# Patient Record
Sex: Male | Born: 2000 | Race: White | Hispanic: No | Marital: Single | Smoking: Current every day smoker
Health system: Southern US, Community
[De-identification: ages and names within clinical notes are randomized; demographics above are authoritative.]

## PROBLEM LIST (undated history)

## (undated) DIAGNOSIS — Z8489 Family history of other specified conditions: Secondary | ICD-10-CM

## (undated) DIAGNOSIS — F4310 Post-traumatic stress disorder, unspecified: Secondary | ICD-10-CM

## (undated) DIAGNOSIS — J45909 Unspecified asthma, uncomplicated: Secondary | ICD-10-CM

## (undated) HISTORY — DX: Post-traumatic stress disorder, unspecified: F43.10

## (undated) HISTORY — PX: ADENOIDECTOMY: SUR15

## (undated) HISTORY — PX: TYMPANOSTOMY TUBE PLACEMENT: SHX32

---
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
## 2019-02-24 ENCOUNTER — Other Ambulatory Visit: Payer: Self-pay

## 2019-02-24 ENCOUNTER — Ambulatory Visit (HOSPITAL_COMMUNITY)
Admission: EM | Admit: 2019-02-24 | Discharge: 2019-02-24 | Disposition: A | Discharge status: Home / Self Care | Payer: Medicaid Other | Attending: Internal Medicine | Admitting: Internal Medicine

## 2019-02-24 ENCOUNTER — Encounter (HOSPITAL_COMMUNITY): Payer: Self-pay

## 2019-02-24 DIAGNOSIS — J209 Acute bronchitis, unspecified: Secondary | ICD-10-CM | POA: Diagnosis not present

## 2019-02-24 DIAGNOSIS — R059 Cough, unspecified: Secondary | ICD-10-CM

## 2019-02-24 DIAGNOSIS — R05 Cough: Secondary | ICD-10-CM

## 2019-02-24 HISTORY — DX: Unspecified asthma, uncomplicated: J45.909

## 2019-02-24 MED ORDER — AZITHROMYCIN 250 MG PO TABS: 250.0000 mg | ORAL_TABLET | Freq: Every day | ORAL | 0 refills | Status: AC

## 2019-02-24 MED ORDER — DM-GUAIFENESIN ER 30-600 MG PO TB12: 1.0000 | ORAL_TABLET | Freq: Two times a day (BID) | ORAL | 0 refills | Status: AC

## 2019-02-24 MED ORDER — BENZONATATE 100 MG PO CAPS: 100.0000 mg | ORAL_CAPSULE | Freq: Three times a day (TID) | ORAL | 0 refills | Status: AC

## 2019-02-24 NOTE — ED Provider Notes (Signed)
[place]    CSN: [id number] Arrival date & time: 02/24/19  1340      History   Chief Complaint Chief Complaint  Patient presents with  . Cough    HPI Bill Wood is a 18 y.o. male.   The history is provided by the patient. No language interpreter was used.  Cough Cough characteristics:  Productive Sputum characteristics:  Yellow and green Severity:  Moderate Onset quality:  Sudden Duration:  1 week Timing:  Intermittent Progression:  Unchanged Chronicity:  New Smoker: yes   Context: smoke exposure   Context: not animal exposure, not exposure to allergens, not occupational exposure and not sick contacts   Relieved by:  Cough suppressants Worsened by:  Nothing Ineffective treatments:  Cough suppressants Associated symptoms: no chest pain, no chills, no ear fullness, no ear pain, no fever, no headaches, no rash, no shortness of breath, no sinus congestion and no sore throat   Risk factors: no recent travel     Past Medical History:  Diagnosis Date  . Asthma   . Family history of adverse reaction to anesthesia    pt's mother has hx. of post-op N/V  . PTSD (post-traumatic stress disorder)    sep. anxiety    Patient Active Problem List   Diagnosis Date Noted  . Chronic bilateral low back pain without sciatica 09/23/2017  . Closed displaced fracture of shaft of fifth metacarpal bone of right hand 06/23/2017  . 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 ADENOIDECTOMY     age 7  . CLOSED REDUCTION FINGER WITH PERCUTANEOUS PINNING Right 06/24/2017   Procedure: CLOSED REDUCTION PERCUTANEOUS PINNING RIGHT 5TH METACARPAL;  Surgeon: Leandrew Koyanagi, MD;  Location: [facility];  Service: Orthopedics;  Laterality: Right;  . TYMPANOSTOMY TUBE PLACEMENT Bilateral        Home Medications    Prior to Admission medications   Medication Sig Start Date End Date Taking? Authorizing Provider   azithromycin (ZITHROMAX) 250 MG tablet Take 1 tablet (250 mg total) by mouth daily. Take first 2 tablets together, then 1 every day until finished. 02/24/19   Matelyn Antonelli, Darrelyn Hillock, FNP  benzonatate (TESSALON) 100 MG capsule Take 1 capsule (100 mg total) by mouth every 8 (eight) hours. 02/24/19   Trystyn Dolley, Darrelyn Hillock, FNP  cefdinir (OMNICEF) 300 MG capsule Take 1 capsule (300 mg total) by mouth 2 (two) times daily. Patient not taking: Reported on 03/10/2018 01/10/18   Mesner, Corene Cornea, MD  dextromethorphan-guaiFENesin [facility] DM) 30-600 MG 12hr tablet Take 1 tablet by mouth 2 (two) times daily. 02/24/19   Lenwood Balsam, Darrelyn Hillock, FNP  ibuprofen (ADVIL,MOTRIN) 200 MG tablet Take 200 mg by mouth daily as needed for moderate pain.    [provider]  ibuprofen (ADVIL,MOTRIN) 800 MG tablet Take 1 tablet (800 mg total) by mouth every 8 (eight) hours as needed for fever or moderate pain. 03/10/18   Triplett, Tammy, PA-C  magic mouthwash w/lidocaine SOLN Take 5 mLs by mouth 3 (three) times daily as needed for mouth pain. Swish and spit, do not swallow 03/10/18   Triplett, Tammy, PA-C    Family History Family History  Problem Relation Age of Onset  . Diabetes Mother   . Anesthesia problems Mother        post-op N/V    Social History Social History   Tobacco Use  . Smoking status: Current Every Day Smoker    Packs/day: 0.50  Years: 2.00    Pack years: 1.00    Types: Cigarettes  . Smokeless tobacco: Never Used  Substance Use Topics  . Alcohol use: No  . Drug use: No     Allergies   Amoxicillin   Review of Systems Review of Systems  Constitutional: Negative for activity change, appetite change, chills, fatigue and fever.  HENT: Negative for ear pain, sinus pressure, sinus pain and sore throat.   Respiratory: Positive for cough. Negative for chest tightness and shortness of breath.   Cardiovascular: Negative for chest pain and leg swelling.  Skin: Negative for rash.  Neurological:  Negative for headaches.  ROS: all other are negatives   Physical Exam Triage Vital Signs ED Triage Vitals  Enc Vitals Group     BP 02/24/19 1412 105/79     Pulse Rate 02/24/19 1412 82     Resp 02/24/19 1412 18     Temp 02/24/19 1412 98.1 F (36.7 C)     Temp Source 02/24/19 1412 Oral     SpO2 02/24/19 1412 100 %     Weight 02/24/19 1411 154 lb (69.9 kg)     Height --      Head Circumference --      Peak Flow --      Pain Score 02/24/19 1411 5     Pain Loc --      Pain Edu? --      Excl. in GC? --    No data found.  Updated Vital Signs BP 105/79 (BP Location: Right Arm)   Pulse 82   Temp 98.1 F (36.7 C) (Oral)   Resp 18   Wt 154 lb (69.9 kg)   SpO2 100%   BMI 22.42 kg/m   Visual Acuity Right Eye Distance:   Left Eye Distance:   Bilateral Distance:    Right Eye Near:   Left Eye Near:    Bilateral Near:     Physical Exam Vitals signs and nursing note reviewed.  Constitutional:      General: He is not in acute distress.    Appearance: Normal appearance. He is normal weight. He is not ill-appearing or toxic-appearing.  HENT:     Head: Normocephalic.     Right Ear: Tympanic membrane, ear canal and external ear normal. There is no impacted cerumen.     Left Ear: Tympanic membrane, ear canal and external ear normal. There is no impacted cerumen.     Mouth/Throat:     Mouth: Mucous membranes are moist.  Cardiovascular:     Rate and Rhythm: Normal rate and regular rhythm.     Pulses: Normal pulses.     Heart sounds: Normal heart sounds. No murmur.  Pulmonary:     Effort: Pulmonary effort is normal. No respiratory distress.     Breath sounds: Normal breath sounds. No wheezing or rhonchi.  Chest:     Chest wall: No tenderness.  Neurological:     Mental Status: He is alert and oriented to person, place, and time.      UC Treatments / Results  Labs (all labs ordered are listed, but only abnormal results are displayed) Labs Reviewed - No data to display   EKG   Radiology No results found.  Procedures Procedures (including critical care time)  Medications Ordered in UC Medications - No data to display  Initial Impression / Assessment and Plan / UC Course  I have reviewed the triage vital signs and the nursing notes.  Pertinent labs &  imaging results that were available during my care of the patient were reviewed by me and considered in my medical decision making (see chart for details).   patient symptom more likely of acute bronchitis. Patient was davised to take his medication as prescribed and to follow up if symptom get worse Final Clinical Impressions(s) / UC Diagnoses   Final diagnoses:  Cough  Acute bronchitis, unspecified organism     Discharge Instructions     Get rest, and drink fluids Prescribed tessalone perles as needed for cough.  Azithromycin prescribed take as directed and to completion Use throat lozenges, hot tea, honey, and avoid smoking If your symptom is getting worse seek emergent care    ED Prescriptions    Medication Sig Dispense Auth. Provider   dextromethorphan-guaiFENesin (MUCINEX DM) 30-600 MG 12hr tablet Take 1 tablet by mouth 2 (two) times daily. 30 tablet Kaylin Marcon, Zachery DakinsKomlanvi S, FNP   benzonatate (TESSALON) 100 MG capsule Take 1 capsule (100 mg total) by mouth every 8 (eight) hours. 21 capsule Janeen Watson S, FNP   azithromycin (ZITHROMAX) 250 MG tablet Take 1 tablet (250 mg total) by mouth daily. Take first 2 tablets together, then 1 every day until finished. 6 tablet Arpan Eskelson, Zachery DakinsKomlanvi S, FNP     PDMP not reviewed this encounter.   Durward Parcelvegno, Srija Southard S, FNP 02/24/19 1505

## 2019-02-24 NOTE — ED Triage Notes (Signed)
Pt states she has a deep cough. Pt states he thinks it's bronchitis he usual gets it this time of the year. This has been going on x 1 week.

## 2019-02-24 NOTE — Discharge Instructions (Addendum)
Get rest, and drink fluids Prescribed tessalone perles as needed for cough.  Azithromycin prescribed take as directed and to completion Use throat lozenges, hot tea, honey, and avoid smoking If your symptom is getting worse seek emergent care

## 2021-01-01 ENCOUNTER — Emergency Department (HOSPITAL_COMMUNITY)
Admission: EM | Admit: 2021-01-01 | Discharge: 2021-01-01 | Disposition: A | Discharge status: Home / Self Care | Payer: Medicaid Other | Attending: Emergency Medicine | Admitting: Emergency Medicine

## 2021-01-01 ENCOUNTER — Encounter (HOSPITAL_COMMUNITY): Payer: Self-pay | Admitting: Emergency Medicine

## 2021-01-01 ENCOUNTER — Other Ambulatory Visit: Payer: Self-pay

## 2021-01-01 DIAGNOSIS — Z79899 Other long term (current) drug therapy: Secondary | ICD-10-CM | POA: Insufficient documentation

## 2021-01-01 DIAGNOSIS — F17210 Nicotine dependence, cigarettes, uncomplicated: Secondary | ICD-10-CM | POA: Insufficient documentation

## 2021-01-01 DIAGNOSIS — F1094 Alcohol use, unspecified with alcohol-induced mood disorder: Secondary | ICD-10-CM | POA: Diagnosis not present

## 2021-01-01 DIAGNOSIS — F10920 Alcohol use, unspecified with intoxication, uncomplicated: Secondary | ICD-10-CM

## 2021-01-01 DIAGNOSIS — Y906 Blood alcohol level of 120-199 mg/100 ml: Secondary | ICD-10-CM | POA: Insufficient documentation

## 2021-01-01 DIAGNOSIS — F10929 Alcohol use, unspecified with intoxication, unspecified: Secondary | ICD-10-CM

## 2021-01-01 DIAGNOSIS — J45909 Unspecified asthma, uncomplicated: Secondary | ICD-10-CM | POA: Insufficient documentation

## 2021-01-01 DIAGNOSIS — R45851 Suicidal ideations: Secondary | ICD-10-CM | POA: Insufficient documentation

## 2021-01-01 HISTORY — DX: Alcohol use, unspecified with intoxication, unspecified: F10.929

## 2021-01-01 LAB — BASIC METABOLIC PANEL
Anion gap: 10 (ref 5–15)
BUN: 10 mg/dL (ref 6–20)
CO2: 24 mmol/L (ref 22–32)
Calcium: 8.8 mg/dL — ABNORMAL LOW (ref 8.9–10.3)
Chloride: 109 mmol/L (ref 98–111)
Creatinine, Ser: 0.81 mg/dL (ref 0.61–1.24)
GFR, Estimated: >60 mL/min (ref >60)
Glucose, Bld: 113 mg/dL — ABNORMAL HIGH (ref 70–99)
Potassium: 4 mmol/L (ref 3.5–5.1)
Sodium: 143 mmol/L (ref 135–145)

## 2021-01-01 LAB — CBC WITH DIFFERENTIAL/PLATELET
Abs Immature Granulocytes: 0.04 10*3/uL (ref 0.00–0.07)
Basophils Absolute: 0.1 10*3/uL (ref 0.0–0.1)
Basophils Relative: 1 %
Eosinophils Absolute: 0.1 10*3/uL (ref 0.0–0.5)
Eosinophils Relative: 1 %
HCT: 46.3 % (ref 39.0–52.0)
Hemoglobin: 16.3 g/dL (ref 13.0–17.0)
Immature Granulocytes: 0 %
Lymphocytes Relative: 24 %
Lymphs Abs: 2.4 10*3/uL (ref 0.7–4.0)
MCH: 29.6 pg (ref 26.0–34.0)
MCHC: 35.2 g/dL (ref 30.0–36.0)
MCV: 84.2 fL (ref 80.0–100.0)
Monocytes Absolute: 0.7 10*3/uL (ref 0.1–1.0)
Monocytes Relative: 7 %
Neutro Abs: 6.7 10*3/uL (ref 1.7–7.7)
Neutrophils Relative %: 67 %
Platelets: 338 10*3/uL (ref 150–400)
RBC: 5.5 MIL/uL (ref 4.22–5.81)
RDW: 12.3 % (ref 11.5–15.5)
WBC: 10 10*3/uL (ref 4.0–10.5)
nRBC: 0 % (ref 0.0–0.2)

## 2021-01-01 LAB — RAPID URINE DRUG SCREEN, HOSP PERFORMED
Amphetamines: NOT DETECTED
Barbiturates: NOT DETECTED
Benzodiazepines: NOT DETECTED
Cocaine: NOT DETECTED
Opiates: NOT DETECTED
Tetrahydrocannabinol: POSITIVE — AB

## 2021-01-01 LAB — ETHANOL: Alcohol, Ethyl (B): 180 mg/dL — ABNORMAL HIGH (ref <10)

## 2021-01-01 NOTE — ED Provider Notes (Signed)
[facility] EMERGENCY DEPARTMENT Provider Note   CSN: [id number] Arrival date & time: 01/01/21  7829     History Chief Complaint  Patient presents with   Alcohol Intoxication    Bill Wood is a 20 y.o. male.  Patient is a 20 year old male with past medical history of PTSD and asthma.  Patient presenting here for alcohol intoxication and suicidal ideation.  Patient tells me that he has had tension between he and his wife's family and his own family.  Tonight things turned into an argument and patient became upset.  He reports drinking nearly an entire bottle of 80 proof bourbon.  He tells me that he is suicidal and feels as though the world would be a better place without him.  He had been on Wellbutrin as a teenager, but has not seen a doctor in several years.  He admits to alcohol and marijuana use.  The history is provided by the patient.      Past Medical History:  Diagnosis Date   Asthma    Family history of adverse reaction to anesthesia    pt's mother has hx. of post-op N/V   PTSD (post-traumatic stress disorder)    sep. anxiety    Patient Active Problem List   Diagnosis Date Noted   Chronic bilateral low back pain without sciatica 09/23/2017   Closed displaced fracture of shaft of fifth metacarpal bone of right hand 06/23/2017   Separation anxiety 07/29/2012    Past Surgical History:  Procedure Laterality Date   ADENOIDECTOMY     age 31   CLOSED REDUCTION FINGER WITH PERCUTANEOUS PINNING Right 06/24/2017   Procedure: CLOSED REDUCTION PERCUTANEOUS PINNING RIGHT 5TH METACARPAL;  Surgeon: Tarry Kos, MD;  Location: [facility];  Service: Orthopedics;  Laterality: Right;   TYMPANOSTOMY TUBE PLACEMENT Bilateral        Family History  Problem Relation Age of Onset   Diabetes Mother    Anesthesia problems Mother        post-op N/V    Social History   Tobacco Use   Smoking status: Every Day    Packs/day: 0.50    Years: 2.00    Pack years:  1.00    Types: Cigarettes   Smokeless tobacco: Never  Vaping Use   Vaping Use: Never used  Substance Use Topics   Alcohol use: Yes    Comment: once weekly   Drug use: Yes    Types: Marijuana    Home Medications Prior to Admission medications   Medication Sig Start Date End Date Taking? Authorizing Provider  azithromycin (ZITHROMAX) 250 MG tablet Take 1 tablet (250 mg total) by mouth daily. Take first 2 tablets together, then 1 every day until finished. 02/24/19   Avegno, Zachery Dakins, FNP  benzonatate (TESSALON) 100 MG capsule Take 1 capsule (100 mg total) by mouth every 8 (eight) hours. 02/24/19   Avegno, Zachery Dakins, FNP  cefdinir (OMNICEF) 300 MG capsule Take 1 capsule (300 mg total) by mouth 2 (two) times daily. Patient not taking: Reported on 03/10/2018 01/10/18   Mesner, Barbara Cower, MD  dextromethorphan-guaiFENesin [facility] DM) 30-600 MG 12hr tablet Take 1 tablet by mouth 2 (two) times daily. 02/24/19   Avegno, Zachery Dakins, FNP  ibuprofen (ADVIL,MOTRIN) 200 MG tablet Take 200 mg by mouth daily as needed for moderate pain.    [provider]  ibuprofen (ADVIL,MOTRIN) 800 MG tablet Take 1 tablet (800 mg total) by mouth every 8 (eight) hours as needed for  fever or moderate pain. 03/10/18   Triplett, Tammy, PA-C  magic mouthwash w/lidocaine SOLN Take 5 mLs by mouth 3 (three) times daily as needed for mouth pain. Swish and spit, do not swallow 03/10/18   Triplett, Tammy, PA-C    Allergies    Amoxicillin  Review of Systems   Review of Systems  All other systems reviewed and are negative.  Physical Exam Updated Vital Signs BP 124/77   Pulse 95   Resp 12   Ht 5' 9.5" (1.765 m)   Wt 63 kg   SpO2 100%   BMI 20.23 kg/m   Physical Exam Vitals and nursing note reviewed.  Constitutional:      General: He is not in acute distress.    Appearance: He is well-developed. He is not diaphoretic.     Comments: Patient is tearful and appears intoxicated.  The odor of alcohol is present.   HENT:     Head: Normocephalic and atraumatic.  Cardiovascular:     Rate and Rhythm: Normal rate and regular rhythm.     Heart sounds: No murmur heard.   No friction rub.  Pulmonary:     Effort: Pulmonary effort is normal. No respiratory distress.     Breath sounds: Normal breath sounds. No wheezing or rales.  Abdominal:     General: Bowel sounds are normal. There is no distension.     Palpations: Abdomen is soft.     Tenderness: There is no abdominal tenderness.  Musculoskeletal:        General: Normal range of motion.     Cervical back: Normal range of motion and neck supple.  Skin:    General: Skin is warm and dry.  Neurological:     Mental Status: He is alert and oriented to person, place, and time.     Coordination: Coordination normal.  Psychiatric:        Mood and Affect: Mood is depressed.        Behavior: Behavior is cooperative.        Thought Content: Thought content includes suicidal ideation. Thought content does not include homicidal ideation. Thought content does not include homicidal or suicidal plan.        Cognition and Memory: Cognition normal.    ED Results / Procedures / Treatments   Labs (all labs ordered are listed, but only abnormal results are displayed) Labs Reviewed - No data to display  EKG None  Radiology No results found.  Procedures Procedures   Medications Ordered in ED Medications - No data to display  ED Course  I have reviewed the triage vital signs and the nursing notes.  Pertinent labs & imaging results that were available during my care of the patient were reviewed by me and considered in my medical decision making (see chart for details).    MDM Rules/Calculators/A&P  Patient arrives here intoxicated and tearful complaining of suicidal ideation.  Patient will be evaluated by TTS once medically cleared.  They will assist in determining the final disposition.  Care signed out to oncoming provider at shift change.  Final  Clinical Impression(s) / ED Diagnoses Final diagnoses:  None    Rx / DC Orders ED Discharge Orders     None        Geoffery Lyons, MD 01/02/21 [phone]

## 2021-01-01 NOTE — ED Triage Notes (Signed)
Pt states he drank 80 proof alcohol and smoked some thc today. Pt called ems for alcohol poisoning.

## 2021-01-01 NOTE — ED Notes (Signed)
ED Provider at bedside. 

## 2021-01-01 NOTE — ED Provider Notes (Signed)
Blood pressure 122/66, pulse 73, temperature 98.5 F (36.9 C), resp. rate 14, height 5' 9.5" (1.765 m), weight 63 kg, SpO2 100 %.  Assuming care from Dr. Judd Lien.  In short, Bill Wood is a 20 y.o. male with a chief complaint of Alcohol Intoxication and Suicidal .  Refer to the original H&P for additional details.  The current plan of care is to follow up after TTS evaluation.  10:27 AM  TTS completed their evaluation. Patient is clear for d/c. Patient denies any SI/HI. Provided resources for outpatient follow up in the AVS. Plan for d/c.     Maia Plan, MD 01/01/21 1028

## 2021-01-01 NOTE — BH Assessment (Signed)
Comprehensive Clinical Assessment (CCA) Note  01/01/2021 Bill Wood [id number]  Disposition: Melbourne Abts, PA, recommends observation for safety and stabilization with psych reassessment this AM. Dorathy Daft, RN, informed of disposition.  The patient demonstrates the following risk factors for suicide: Chronic risk factors for suicide include: N/A. Acute risk factors for suicide include: family or marital conflict. Protective factors for this patient include: positive social support and coping skills. Considering these factors, the overall suicide risk at this point appears to be moderate. Patient is appropriate for outpatient follow up.   Flowsheet Row ED from 01/01/2021 in [facility] EMERGENCY DEPARTMENT  C-SSRS RISK CATEGORY High Risk      Patient is a 20 year old male presenting voluntary to [facility] due to SI and intoxication. Patient admitted to [facility] with no plan. Patient reported drinking nearly an entire bottle of 80 proof bourbon. Patient reported his main stressor includes family, "my family and my wife's family". Patient reported tension between familys. Patient reported onset of SI was tonight's argument between mother and stepfather. Patient reported he normally does not drink and only drank because he couldn't cope with arguments. Patient reported mother and stepfather argues every night. Patient reported worsening depressive symptoms due to family issues. Patient reported drinking alcohol and smoking marijuana, amounts of marijuana are unknown. Patient denied prior suicide attempts and reports cutting himself when he was 20 years old, no additional information provided. Patient denied receiving any outpatient mental health services. Patient denied being prescribed any medications.  Patient resides with wife, 68 year old son, mother and stepfather. Patient reported family discord. Patient is currently self-employed. Patient denied access to guns. Patient contracted for safety, stating "I just need  to alcohol to wear off". Patient was cooperative during assessment. Patient gave consent to speak with wife and sister, however patient unable to recall there contact phone numbers.   Chief Complaint:  Chief Complaint  Patient presents with   Alcohol Intoxication   Suicidal   Visit Diagnosis:  Major depressive disorder  CCA Biopsychosocial Patient Reported Schizophrenia/Schizoaffective Diagnosis in Past: No data recorded  Strengths: self-awareness  Mental Health Symptoms Depression:   Worthlessness; Change in energy/activity; Fatigue; Hopelessness; Irritability   Duration of Depressive symptoms:  Duration of Depressive Symptoms: Less than two weeks   Mania:   None   Anxiety:    None   Psychosis:   None   Duration of Psychotic symptoms:    Trauma:   None   Obsessions:   None   Compulsions:   None   Inattention:   None   Hyperactivity/Impulsivity:   None   Oppositional/Defiant Behaviors:   None   Emotional Irregularity:   None   Other Mood/Personality Symptoms:  No data recorded   Mental Status Exam Appearance and self-care  Stature:   Average   Weight:   Average weight   Clothing:   Casual   Grooming:   Normal   Cosmetic use:   None   Posture/gait:   Normal   Motor activity:   Not Remarkable   Sensorium  Attention:   Normal   Concentration:  No data recorded  Orientation:   X5   Recall/memory:   Normal   Affect and Mood  Affect:   Appropriate   Mood:   Depressed   Relating  Eye contact:   Normal   Facial expression:   Depressed; Sad   Attitude toward examiner:   Cooperative   Thought and Language  Speech flow:  Clear and Coherent  Thought content:   Appropriate to Mood and Circumstances   Preoccupation:   None   Hallucinations:   None   Organization:  No data recorded  [employer] of Knowledge:   Average   Intelligence:   Average   Abstraction:   Normal   Judgement:    Impaired   Reality Testing:   Adequate   Insight:   Fair   Decision Making:   Normal   Social Functioning  Social Maturity:   Responsible   Social Judgement:  No data recorded  Stress  Stressors:   Family conflict   Coping Ability:   Human resources officer Deficits:   Self-control; Decision making   Supports:   Family    Religion: Religion/Spirituality Are You 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?: No  Education: Education Is Patient Currently Attending School?: No Last Grade Completed: 12 Did You Product manager?: No  CCA Family/Childhood History Family and Relationship History: Family history Marital status: Married Number of Years Married: 2 What types of issues is patient dealing with in the relationship?: family discord with his family and spouses family Does patient have children?: Yes How many children?: 1 How is patient's relationship with their children?: good  Childhood History:  Childhood History By whom was/is the patient raised?: Mother, Other (Comment) (stepfather) Did patient suffer any verbal/emotional/physical/sexual abuse as a child?: No Did patient suffer from severe childhood neglect?: No Has patient ever been sexually abused/assaulted/raped as an adolescent or adult?: No Was the patient ever a victim of a crime or a disaster?: No  Child/Adolescent Assessment:   CCA Substance Use Alcohol/Drug Use:   ASAM's:  Six Dimensions of  Multidimensional Assessment  Dimension 1:  Acute Intoxication and/or Withdrawal Potential:      Dimension 2:  Biomedical Conditions and Complications:      Dimension 3:  Emotional, Behavioral, or Cognitive Conditions and Complications:     Dimension 4:  Readiness to Change:     Dimension 5:  Relapse, Continued use, or Continued Problem Potential:     Dimension 6:  Recovery/Living Environment:     ASAM Severity Score:    ASAM Recommended Level of Treatment:     Substance use Disorder (SUD)   Recommendations for Services/Supports/Treatments:   Discharge Disposition:   DSM5 Diagnoses: Patient Active Problem List   Diagnosis Date Noted   Chronic bilateral low back pain without sciatica 09/23/2017   Closed displaced fracture of shaft of fifth metacarpal bone of right hand 06/23/2017   Separation anxiety 07/29/2012   Referrals to Alternative Service(s): Referred to Alternative Service(s):   Place:   Date:   Time:    Referred to Alternative Service(s):   Place:   Date:   Time:    Referred to Alternative Service(s):   Place:   Date:   Time:    Referred to Alternative Service(s):   Place:   Date:   Time:     Burnetta Sabin, [facility]

## 2021-01-01 NOTE — ED Notes (Signed)
Pt to be reassessed

## 2021-01-01 NOTE — Discharge Instructions (Signed)
Patient has been advised to seek additional psychiatric treatment that included medication management and outpatient therapy. Patient can secure those services by contacting a facility that is listed below:   [facility]  [address] [place], [place]  [phone]  [facility]  [address], [place], [place] [address] [phone]  [facility]  [address] [place], [place], [place] [address] [phone]

## 2021-01-01 NOTE — Consult Note (Signed)
Telepsych Consultation   Reason for Consult:  psychiatric evaluation Referring Physician:  Dr. Jacqulyn Bath, EDP Location of Patient: [facility] Location of Provider: [facility]  Patient Identification: Bill Wood MRN:  [id number] Principal Diagnosis: Alcohol intoxication (HCC) Diagnosis:  Principal Problem:   Alcohol intoxication (HCC)   Total Time spent with patient: 20 minutes  Subjective:   Bill Wood is a 20 y.o. male patient admitted with per TTS note:   Patient is a 20 year old male presenting voluntary to [facility] due to SI and intoxication. Patient admitted to [facility] with no plan. Patient reported drinking nearly an entire bottle of 80 proof bourbon. Patient reported his main stressor includes family, "my family and my wife's family". Patient reported tension between familys. Patient reported onset of SI was tonight's argument between mother and stepfather. Patient reported he normally does not drink and only drank because he couldn't cope with arguments. Patient reported mother and stepfather argues every night. Patient reported worsening depressive symptoms due to family issues. Patient reported drinking alcohol and smoking marijuana, amounts of marijuana are unknown. Patient denied prior suicide attempts and reports cutting himself when he was 20 years old, no additional information provided. Patient denied receiving any outpatient mental health services. Patient denied being prescribed any medications.   Patient resides with wife, 43 year old son, mother and stepfather. Patient reported family discord. Patient is currently self-employed. Patient denied access to guns. Patient contracted for safety, stating "I just need to alcohol to wear off". Patient was cooperative during assessment. Patient gave consent to speak with wife and sister, however patient unable to recall there contact phone numbers.   Marland Kitchen  HPI:  Bill Wood is 20 year old male seen by this provider via tele psych at [facility].  Alert and oriented x 3 with good eye contact. Patient reports there was a lot of arguing at my house between my mom and step dad, I started drinking more than I usually drink, he was completely out of it and said things that he should not have said. Ethanol level on admission: 180. UDS: + THC (uses delta 8).   Today, "I feel a lot better, my mind is clear, and I know there are things I need to change". Denies suicidal ideation, no intent , no plan, denies homicidal ideation, denies auditory and visual hallucinations. Does not appear to be responding to internal or external stimuli. He is able to contract for safety.   Reports he drank 3/4 of a fifth of liquor (which is more than he usually drinks), uses delta 8. He was intoxicated and made him behave differently. He is sober and his mind is cleared today and he wants to go home.   Self-employed, plans to move out of his mom and step dad's house soon. Has 31 year old son which is his protective factor. His son has "changed my life".   Sleeps 9-10 hours per night. Appetite normal. No previous psychiatric treatment. No legal issues or court dates pending.   Wishes to receive resources for outpatient therapy at discharge. Reports he will stop drinking alcohol in the future.   Past Psychiatric History: none  Risk to Self:  no Risk to Others:  no Prior Inpatient Therapy:  no Prior Outpatient Therapy:  no  Past Medical History:  Past Medical History:  Diagnosis Date   Asthma    Family history of adverse reaction to anesthesia    pt's mother has hx. of post-op N/V   PTSD (  post-traumatic stress disorder)    sep. anxiety    Past Surgical History:  Procedure Laterality Date   ADENOIDECTOMY     age 51   CLOSED REDUCTION FINGER WITH PERCUTANEOUS PINNING Right 06/24/2017   Procedure: CLOSED REDUCTION PERCUTANEOUS PINNING RIGHT 5TH METACARPAL;  Surgeon: Tarry Kos, MD;  Location: [facility];  Service: Orthopedics;  Laterality: Right;    TYMPANOSTOMY TUBE PLACEMENT Bilateral    Family History:  Family History  Problem Relation Age of Onset   Diabetes Mother    Anesthesia problems Mother        post-op N/V   Family Psychiatric  History: unknown Social History:  Social History   Substance and Sexual Activity  Alcohol Use Yes   Comment: once weekly     Social History   Substance and Sexual Activity  Drug Use Yes   Types: Marijuana    Social History   Socioeconomic History   Marital status: Single    Spouse name: Not on file   Number of children: Not on file   Years of education: Not on file   Highest education level: Not on file  Occupational History   Not on file  Tobacco Use   Smoking status: Every Day    Packs/day: 0.50    Years: 2.00    Pack years: 1.00    Types: Cigarettes   Smokeless tobacco: Never  Vaping Use   Vaping Use: Never used  Substance and Sexual Activity   Alcohol use: Yes    Comment: once weekly   Drug use: Yes    Types: Marijuana   Sexual activity: Never  Other Topics Concern   Not on file  Social History Narrative   Not on file   Social Determinants of Health   Financial Resource Strain: Not on file  Food Insecurity: Not on file  Transportation Needs: Not on file  Physical Activity: Not on file  Stress: Not on file  Social Connections: Not on file   Additional Social History:    Allergies:   Allergies  Allergen Reactions   Amoxicillin Hives    Labs:  Results for orders placed or performed during the hospital encounter of 01/01/21 (from the past 48 hour(s))  Basic metabolic panel     Status: Abnormal   Collection Time: 01/01/21  3:12 AM  Result Value Ref Range   Sodium 143 135 - 145 mmol/L   Potassium 4.0 3.5 - 5.1 mmol/L   Chloride 109 98 - 111 mmol/L   CO2 24 22 - 32 mmol/L   Glucose, Bld 113 (H) 70 - 99 mg/dL    Comment: Glucose reference range applies only to samples taken after fasting for at least 8 hours.   BUN 10 6 - 20 mg/dL   Creatinine,  Ser 9.38 0.61 - 1.24 mg/dL   Calcium 8.8 (L) 8.9 - 10.3 mg/dL   GFR, Estimated >18 >29 mL/min    Comment: (NOTE) Calculated using the CKD-EPI Creatinine Equation (2021)    Anion gap 10 5 - 15    Comment: Performed at [facility], [address], [place], [place] [address]  Ethanol     Status: Abnormal   Collection Time: 01/01/21  3:12 AM  Result Value Ref Range   Alcohol, Ethyl (B) 180 (H) <10 mg/dL    Comment: (NOTE) Lowest detectable limit for serum alcohol is 10 mg/dL.  For medical purposes only. Performed at [facility], [address] [place], [place] [address]   CBC  with Differential     Status: None   Collection Time: 01/01/21  3:12 AM  Result Value Ref Range   WBC 10.0 4.0 - 10.5 K/uL   RBC 5.50 4.22 - 5.81 MIL/uL   Hemoglobin 16.3 13.0 - 17.0 g/dL   HCT 12.8 78.6 - 76.7 %   MCV 84.2 80.0 - 100.0 fL   MCH 29.6 26.0 - 34.0 pg   MCHC 35.2 30.0 - 36.0 g/dL   RDW 20.9 47.0 - 96.2 %   Platelets 338 150 - 400 K/uL   nRBC 0.0 0.0 - 0.2 %   Neutrophils Relative % 67 %   Neutro Abs 6.7 1.7 - 7.7 K/uL   Lymphocytes Relative 24 %   Lymphs Abs 2.4 0.7 - 4.0 K/uL   Monocytes Relative 7 %   Monocytes Absolute 0.7 0.1 - 1.0 K/uL   Eosinophils Relative 1 %   Eosinophils Absolute 0.1 0.0 - 0.5 K/uL   Basophils Relative 1 %   Basophils Absolute 0.1 0.0 - 0.1 K/uL   Immature Granulocytes 0 %   Abs Immature Granulocytes 0.04 0.00 - 0.07 K/uL    Comment: Performed at [facility], [address], [place], [place] [address]  Urine rapid drug screen (hosp performed)     Status: Abnormal   Collection Time: 01/01/21  3:12 AM  Result Value Ref Range   Opiates NONE DETECTED NONE DETECTED   Cocaine NONE DETECTED NONE DETECTED   Benzodiazepines NONE DETECTED NONE DETECTED   Amphetamines NONE DETECTED NONE DETECTED   Tetrahydrocannabinol POSITIVE (A) NONE DETECTED   Barbiturates NONE DETECTED NONE DETECTED    Comment: (NOTE) DRUG SCREEN FOR MEDICAL PURPOSES ONLY.  IF CONFIRMATION IS  NEEDED FOR ANY PURPOSE, NOTIFY LAB WITHIN 5 DAYS.  LOWEST DETECTABLE LIMITS FOR URINE DRUG SCREEN Drug Class                     Cutoff (ng/mL) Amphetamine and metabolites    1000 Barbiturate and metabolites    200 Benzodiazepine                 200 Tricyclics and metabolites     300 Opiates and metabolites        300 Cocaine and metabolites        300 THC                            50 Performed at [facility], [address], [place], [place] [address]     Medications:  No current facility-administered medications for this encounter.   Current Outpatient Medications  Medication Sig Dispense Refill   azithromycin (ZITHROMAX) 250 MG tablet Take 1 tablet (250 mg total) by mouth daily. Take first 2 tablets together, then 1 every day until finished. 6 tablet 0   benzonatate (TESSALON) 100 MG capsule Take 1 capsule (100 mg total) by mouth every 8 (eight) hours. 21 capsule 0   cefdinir (OMNICEF) 300 MG capsule Take 1 capsule (300 mg total) by mouth 2 (two) times daily. (Patient not taking: Reported on 03/10/2018) 14 capsule 0   dextromethorphan-guaiFENesin (MUCINEX DM) 30-600 MG 12hr tablet Take 1 tablet by mouth 2 (two) times daily. 30 tablet 0   ibuprofen (ADVIL,MOTRIN) 200 MG tablet Take 200 mg by mouth daily as needed for moderate pain.     ibuprofen (ADVIL,MOTRIN) 800 MG tablet Take 1 tablet (800 mg total) by mouth every 8 (eight) hours as needed  for fever or moderate pain. 21 tablet 0   magic mouthwash w/lidocaine SOLN Take 5 mLs by mouth 3 (three) times daily as needed for mouth pain. Swish and spit, do not swallow 100 mL 0    Musculoskeletal: Strength & Muscle Tone: within normal limits Gait & Station: normal Patient leans: N/A          Psychiatric Specialty Exam:  Presentation  General Appearance:  Appropriate for Environment Eye Contact: Good Speech: Clear and Coherent; Normal Rate Speech Volume: Normal Handedness: No data recorded  Mood and Affect   Mood: Euthymic Affect: Congruent  Thought Process  Thought Processes: Coherent; Goal Directed Descriptions of Associations:Intact Orientation:Full (Time, Place and Person) Thought Content:Logical History of Schizophrenia/Schizoaffective disorder:No data recorded Duration of Psychotic Symptoms:No data recorded Hallucinations:Hallucinations: None Ideas of Reference:None Suicidal Thoughts:Suicidal Thoughts: No Homicidal Thoughts:Homicidal Thoughts: No  Sensorium  Memory: Immediate Good; Recent Good; Remote Good Judgment: Good Insight: Good  Executive Functions  Concentration: Good Attention Span: Good Recall: Good Fund of Knowledge: Good Language: Good  Psychomotor Activity  Psychomotor Activity: Psychomotor Activity: Normal  Assets  Assets: Communication Skills; Desire for Improvement; Financial Resources/Insurance; Housing; Leisure Time; Intimacy; Physical Health; Resilience; Social Support; Transportation; Vocational/Educational  Sleep  Sleep: Sleep: Good Number of Hours of Sleep: 10   Physical Exam: Physical Exam Vitals reviewed.  Constitutional:      General: He is not in acute distress. Cardiovascular:     Rate and Rhythm: Normal rate.  Pulmonary:     Effort: Pulmonary effort is normal.  Neurological:     Mental Status: He is alert and oriented to person, place, and time.  Psychiatric:        Attention and Perception: Attention normal.        Mood and Affect: Mood normal.        Speech: Speech normal.        Behavior: Behavior is cooperative.        Thought Content: Thought content is not paranoid or delusional. Thought content does not include homicidal or suicidal ideation. Thought content does not include homicidal or suicidal plan.   Review of Systems  Constitutional:  Negative for chills and fever.  Respiratory:  Negative for shortness of breath.   Cardiovascular:  Negative for chest pain.  Gastrointestinal:  Negative for abdominal  pain.  Neurological:  Negative for headaches.  Psychiatric/Behavioral:  Positive for substance abuse. Negative for depression, hallucinations and suicidal ideas. The patient is not nervous/anxious and does not have insomnia.   Blood pressure 114/68, pulse 62, temperature 98.4 F (36.9 C), temperature source Oral, resp. rate 18, height 5' 9.5" (1.765 m), weight 63 kg, SpO2 98 %. Body mass index is 20.23 kg/m.  Treatment Plan Summary: Plan Safe for outpatient psychiatric treatment with resources provided. Psychiatrically cleared. Safety planning reviewed in detail.   Disposition: No evidence of imminent risk to self or others at present.   Patient does not meet criteria for psychiatric inpatient admission. Supportive therapy provided about ongoing stressors. Discussed crisis plan, support from social network, calling 911, coming to the Emergency Department, and calling Suicide Hotline.  Ed staff and EDP notified of disposition via secure chat.   This service was provided via telemedicine using a 2-way, interactive audio and video technology.  Names of all persons participating in this telemedicine service and their role in this encounter. Name: Bill Wood Role: patient  Name: Dorena Bodo Role: NP  Name: Percell Locus Role: MD/psychiatrist    Novella Olive, NP  01/01/2021 11:35 AM

## 2021-01-01 NOTE — ED Notes (Signed)
Pt dressed out, belongings placed in pt bag. (Clothes, necklace, ring, and cell phone)

## 2023-03-26 ENCOUNTER — Other Ambulatory Visit: Payer: Self-pay

## 2023-03-26 ENCOUNTER — Encounter (HOSPITAL_COMMUNITY): Payer: Self-pay

## 2023-03-26 ENCOUNTER — Emergency Department (HOSPITAL_COMMUNITY)
Admission: EM | Admit: 2023-03-26 | Discharge: 2023-03-26 | Discharge status: Against Medical Advice | Payer: Medicaid Other | Attending: Emergency Medicine | Admitting: Emergency Medicine

## 2023-03-26 DIAGNOSIS — Z5321 Procedure and treatment not carried out due to patient leaving prior to being seen by health care provider: Secondary | ICD-10-CM | POA: Insufficient documentation

## 2023-03-26 DIAGNOSIS — F10129 Alcohol abuse with intoxication, unspecified: Secondary | ICD-10-CM | POA: Insufficient documentation

## 2023-03-26 DIAGNOSIS — Y905 Blood alcohol level of 100-119 mg/100 ml: Secondary | ICD-10-CM | POA: Diagnosis not present

## 2023-03-26 DIAGNOSIS — R569 Unspecified convulsions: Secondary | ICD-10-CM | POA: Insufficient documentation

## 2023-03-26 LAB — BASIC METABOLIC PANEL
Anion gap: 10 (ref 5–15)
BUN: 8 mg/dL (ref 6–20)
CO2: 26 mmol/L (ref 22–32)
Calcium: 8.9 mg/dL (ref 8.9–10.3)
Chloride: 101 mmol/L (ref 98–111)
Creatinine, Ser: 0.72 mg/dL (ref 0.61–1.24)
GFR, Estimated: >60 mL/min (ref >60)
Glucose, Bld: 109 mg/dL — ABNORMAL HIGH (ref 70–99)
Potassium: 3.6 mmol/L (ref 3.5–5.1)
Sodium: 137 mmol/L (ref 135–145)

## 2023-03-26 LAB — CBC
HCT: 47.4 % (ref 39.0–52.0)
Hemoglobin: 16.9 g/dL (ref 13.0–17.0)
MCH: 29.3 pg (ref 26.0–34.0)
MCHC: 35.7 g/dL (ref 30.0–36.0)
MCV: 82.1 fL (ref 80.0–100.0)
Platelets: 347 10*3/uL (ref 150–400)
RBC: 5.77 MIL/uL (ref 4.22–5.81)
RDW: 12.2 % (ref 11.5–15.5)
WBC: 11 10*3/uL — ABNORMAL HIGH (ref 4.0–10.5)
nRBC: 0 % (ref 0.0–0.2)

## 2023-03-26 LAB — ETHANOL: Alcohol, Ethyl (B): 110 mg/dL — ABNORMAL HIGH (ref <10)

## 2023-03-26 LAB — CBG MONITORING, ED: Glucose-Capillary: 109 mg/dL — ABNORMAL HIGH (ref 70–99)

## 2023-03-26 NOTE — ED Triage Notes (Signed)
Pt bib [facility] from home with c/o alleged seizure  activity reported by pt spouse. Pt has no hx or dx of seizures,   not on seizure medications, pt told EMS he has seizures every night. Pt appears intoxicated with ETOH on board, spouse told EMS pt has had 8-10 "four locos". EMS reports pt was NOT post ictal upon arrival.

## 2023-03-26 NOTE — ED Notes (Signed)
Patient taking pictures on cell phone on snapchat. Advised not to take pictures in the hospital setting

## 2023-03-26 NOTE — ED Notes (Signed)
Patient left AMA.
